# Patient Record
Sex: Male | Born: 1990 | Race: Black or African American | Hispanic: No | Marital: Single | Smoking: Current every day smoker
Health system: Southern US, Community
[De-identification: ages and names within clinical notes are randomized; demographics above are authoritative.]

## PROBLEM LIST (undated history)

## (undated) DIAGNOSIS — R011 Cardiac murmur, unspecified: Secondary | ICD-10-CM

---
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
## 2010-05-16 ENCOUNTER — Ambulatory Visit: Payer: Self-pay

## 2012-06-19 ENCOUNTER — Emergency Department (HOSPITAL_COMMUNITY)
Admission: EM | Admit: 2012-06-19 | Discharge: 2012-06-19 | Disposition: A | Discharge status: Home / Self Care | Payer: No Typology Code available for payment source | Attending: Emergency Medicine | Admitting: Emergency Medicine

## 2012-06-19 ENCOUNTER — Encounter (HOSPITAL_COMMUNITY): Payer: Self-pay | Admitting: Emergency Medicine

## 2012-06-19 DIAGNOSIS — Y9389 Activity, other specified: Secondary | ICD-10-CM | POA: Insufficient documentation

## 2012-06-19 DIAGNOSIS — Z79899 Other long term (current) drug therapy: Secondary | ICD-10-CM | POA: Insufficient documentation

## 2012-06-19 DIAGNOSIS — F17200 Nicotine dependence, unspecified, uncomplicated: Secondary | ICD-10-CM | POA: Insufficient documentation

## 2012-06-19 DIAGNOSIS — Y92410 Unspecified street and highway as the place of occurrence of the external cause: Secondary | ICD-10-CM | POA: Insufficient documentation

## 2012-06-19 DIAGNOSIS — S7010XA Contusion of unspecified thigh, initial encounter: Secondary | ICD-10-CM | POA: Insufficient documentation

## 2012-06-19 DIAGNOSIS — S7011XA Contusion of right thigh, initial encounter: Secondary | ICD-10-CM

## 2012-06-19 HISTORY — DX: Cardiac murmur, unspecified: R01.1

## 2012-06-19 MED ORDER — CYCLOBENZAPRINE HCL 10 MG PO TABS: 10.0000 mg | ORAL_TABLET | Freq: Two times a day (BID) | ORAL | Status: DC | PRN

## 2012-06-19 MED ORDER — IBUPROFEN 800 MG PO TABS: 800.0000 mg | ORAL_TABLET | Freq: Three times a day (TID) | ORAL | Status: DC

## 2012-06-19 MED ORDER — IBUPROFEN 800 MG PO TABS
800.0000 mg | ORAL_TABLET | Freq: Once | ORAL | Status: AC
  Administered 2012-06-19: 800 mg via ORAL
  Filled 2012-06-19: qty 1
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
## 2012-06-21 NOTE — ED Provider Notes (Signed)
Medical screening examination/treatment/procedure(s) were performed by non-physician practitioner and as supervising physician I was immediately available for consultation/collaboration.   Hurman Horn, MD 06/21/12 [phone]

## 2013-02-24 ENCOUNTER — Emergency Department (HOSPITAL_COMMUNITY)
Admission: EM | Admit: 2013-02-24 | Discharge: 2013-02-25 | Disposition: A | Discharge status: Home / Self Care | Payer: PRIVATE HEALTH INSURANCE | Attending: Emergency Medicine | Admitting: Emergency Medicine

## 2013-02-24 ENCOUNTER — Encounter (HOSPITAL_COMMUNITY): Payer: Self-pay | Admitting: Emergency Medicine

## 2013-02-24 DIAGNOSIS — Y9239 Other specified sports and athletic area as the place of occurrence of the external cause: Secondary | ICD-10-CM | POA: Insufficient documentation

## 2013-02-24 DIAGNOSIS — R609 Edema, unspecified: Secondary | ICD-10-CM | POA: Insufficient documentation

## 2013-02-24 DIAGNOSIS — Y9367 Activity, basketball: Secondary | ICD-10-CM | POA: Insufficient documentation

## 2013-02-24 DIAGNOSIS — M84375A Stress fracture, left foot, initial encounter for fracture: Secondary | ICD-10-CM

## 2013-02-24 DIAGNOSIS — X500XXA Overexertion from strenuous movement or load, initial encounter: Secondary | ICD-10-CM | POA: Insufficient documentation

## 2013-02-24 DIAGNOSIS — S92309A Fracture of unspecified metatarsal bone(s), unspecified foot, initial encounter for closed fracture: Secondary | ICD-10-CM | POA: Insufficient documentation

## 2013-02-24 DIAGNOSIS — F17200 Nicotine dependence, unspecified, uncomplicated: Secondary | ICD-10-CM | POA: Insufficient documentation

## 2013-02-24 NOTE — ED Notes (Signed)
Pt reports playing bball about 1 hour ago and twisted ankle. Mild swelling to area, no deformities. Distal pulses present. Pain 9/10.   p-72, bp-128p

## 2013-02-25 ENCOUNTER — Emergency Department (HOSPITAL_COMMUNITY): Payer: PRIVATE HEALTH INSURANCE

## 2013-02-25 MED ORDER — IBUPROFEN 400 MG PO TABS
800.0000 mg | ORAL_TABLET | Freq: Once | ORAL | Status: AC
  Administered 2013-02-25: 800 mg via ORAL
  Filled 2013-02-25: qty 2

## 2013-02-25 MED ORDER — OXYCODONE-ACETAMINOPHEN 5-325 MG PO TABS: 2.0000 | ORAL_TABLET | ORAL | Status: DC | PRN

## 2013-02-25 NOTE — ED Provider Notes (Signed)
Medical screening examination/treatment/procedure(s) were performed by non-physician practitioner and as supervising physician I was immediately available for consultation/collaboration.  EKG Interpretation   None         Enid Skeens, MD 02/25/13 [phone]

## 2013-02-25 NOTE — ED Provider Notes (Signed)
CSN: [id number]     Arrival date & time 02/24/13  2348 History   First MD Initiated Contact with Patient 02/25/13 0014     Chief Complaint  Patient presents with  . Foot Injury   (Consider location/radiation/quality/duration/timing/severity/associated sxs/prior Treatment) HPI  22 year old male presents for evaluation of left foot injury. Patient states he was playing basketball about 2 hours ago, and as he jumped and landed on his left foot he experienced an acute onset of sharp throbbing pain to the lateral aspects of his feet and also felt a pop. He is unable to bear weight afterwards. Pain is persistent, worsening with movement and improves with rest. Denies any significant ankle or knee pain. Denies any other injury. No specific treatment tried. Denies any numbness or weakness. Denies fever, or rash.    Past Medical History  Diagnosis Date  . Heart murmur    History reviewed. No pertinent past surgical history. No family history on file. History  Substance Use Topics  . Smoking status: Current Every Day Smoker    Types: Cigarettes  . Smokeless tobacco: Not on file  . Alcohol Use: No    Review of Systems  Constitutional: Negative for fever.  Musculoskeletal: Positive for arthralgias.  Skin: Negative for rash and wound.  Neurological: Negative for numbness.    Allergies  Penicillins  Home Medications   Current Outpatient Rx  Name  Route  Sig  Dispense  Refill  . ibuprofen (ADVIL,MOTRIN) 200 MG tablet   Oral   Take 200 mg by mouth every 6 (six) hours as needed.          BP 123/66  Pulse 76  Temp(Src) 98.5 F (36.9 C) (Oral)  Resp 16  Ht 6\' 2"  (1.88 m)  Wt 225 lb (102.059 kg)  BMI 28.88 kg/m2  SpO2 100% Physical Exam  Constitutional: He appears well-developed and well-nourished. No distress.  HENT:  Head: Atraumatic.  Eyes: Conjunctivae are normal.  Neck: Normal range of motion. Neck supple.  Cardiovascular: Intact distal pulses.   Musculoskeletal:     Left knee: Normal.       Left ankle: Normal.       Left foot: He exhibits decreased range of motion, tenderness, bony tenderness, swelling and crepitus. He exhibits normal capillary refill, no deformity and no laceration.       Feet:  Neurological: He is alert.  Skin: No rash noted.  Psychiatric: He has a normal mood and affect.    ED Course  Procedures (including critical care time)  12:53 AM L foot injury, no ankle involvement.  Xray of L foot demonstrate an acute fx at the proximal aspect of the fifth metatarsal.  It is a closed nondisplaced fx. Is NVI.    Postop shoe, and crutches given.  Pain medication prescribed.  Ortho referral given as needed.    Labs Review Labs Reviewed - No data to display Imaging Review Dg Foot Complete Left  02/25/2013   CLINICAL DATA:  Landed on left foot while playing basketball, with sudden onset of left lateral foot pain.  EXAM: LEFT FOOT - COMPLETE 3+ VIEW  COMPARISON:  None.  FINDINGS: There appears to be an acute fracture through an area of chronic stress injury along the proximal aspect of the fifth metatarsal, with associated cortical thickening. No additional fractures are seen. The fracture is essentially nondisplaced. There is no evidence of intra-articular extension.  Visualized joint spaces are preserved. An os peroneum is noted the subtalar joint is unremarkable in  appearance. Mild soft tissue swelling is noted about the fracture site.  IMPRESSION: Apparent acute fracture through an area of underlying chronic stress injury along the proximal aspect of the fifth metatarsal, with associated cortical thickening. The fracture is essentially nondisplaced; no evidence of intra-articular extension.   Electronically Signed   By: Roanna Raider M.D.   On: 02/25/2013 00:23    EKG Interpretation   None       MDM   1. Metatarsal stress fracture, left, initial encounter    BP 123/66  Pulse 76  Temp(Src) 98.5 F (36.9 C) (Oral)  Resp 16  Ht 6'  2" (1.88 m)  Wt 225 lb (102.059 kg)  BMI 28.88 kg/m2  SpO2 100%  I have reviewed nursing notes and vital signs. I personally reviewed the imaging tests through PACS system  I reviewed available ER/hospitalization records thought the EMR     Fayrene Helper, [place] 02/25/13 0059

## 2013-05-03 ENCOUNTER — Emergency Department (HOSPITAL_COMMUNITY)
Admission: EM | Admit: 2013-05-03 | Discharge: 2013-05-03 | Disposition: A | Discharge status: Home / Self Care | Payer: PRIVATE HEALTH INSURANCE | Attending: Emergency Medicine | Admitting: Emergency Medicine

## 2013-05-03 ENCOUNTER — Encounter (HOSPITAL_COMMUNITY): Payer: Self-pay | Admitting: Emergency Medicine

## 2013-05-03 DIAGNOSIS — F17200 Nicotine dependence, unspecified, uncomplicated: Secondary | ICD-10-CM | POA: Insufficient documentation

## 2013-05-03 DIAGNOSIS — T148XXA Other injury of unspecified body region, initial encounter: Secondary | ICD-10-CM

## 2013-05-03 DIAGNOSIS — M79606 Pain in leg, unspecified: Secondary | ICD-10-CM

## 2013-05-03 DIAGNOSIS — IMO0002 Reserved for concepts with insufficient information to code with codable children: Secondary | ICD-10-CM | POA: Insufficient documentation

## 2013-05-03 DIAGNOSIS — Z8781 Personal history of (healed) traumatic fracture: Secondary | ICD-10-CM | POA: Insufficient documentation

## 2013-05-03 DIAGNOSIS — X503XXA Overexertion from repetitive movements, initial encounter: Secondary | ICD-10-CM | POA: Insufficient documentation

## 2013-05-03 DIAGNOSIS — M79609 Pain in unspecified limb: Secondary | ICD-10-CM | POA: Insufficient documentation

## 2013-05-03 DIAGNOSIS — Y929 Unspecified place or not applicable: Secondary | ICD-10-CM | POA: Insufficient documentation

## 2013-05-03 DIAGNOSIS — R011 Cardiac murmur, unspecified: Secondary | ICD-10-CM | POA: Insufficient documentation

## 2013-05-03 DIAGNOSIS — Y939 Activity, unspecified: Secondary | ICD-10-CM | POA: Insufficient documentation

## 2013-05-03 DIAGNOSIS — Z880 Allergy status to penicillin: Secondary | ICD-10-CM | POA: Insufficient documentation

## 2013-05-03 MED ORDER — IBUPROFEN 800 MG PO TABS
800.0000 mg | ORAL_TABLET | Freq: Once | ORAL | Status: AC
  Administered 2013-05-03: 800 mg via ORAL
  Filled 2013-05-03: qty 1
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
## 2013-05-03 NOTE — ED Notes (Signed)
Dr.Knapp at bedside  

## 2013-05-03 NOTE — ED Notes (Signed)
Pt dc to home. Pt sts understanding to dc instructions. Pt ambulatory to exit without difficulty. 
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
## 2013-05-05 NOTE — ED Provider Notes (Signed)
I saw and evaluated the patient, reviewed the resident's note and I agree with the findings and plan.  On exam, no edema or erythema.  Overall low suspicion for DVT although with his recent orthopedic injury will have pt follow up in the am to have a doppler ultrasound.  Celene KrasJon R Patina Spanier, MD 05/05/13 [phone]

## 2013-10-23 ENCOUNTER — Emergency Department (HOSPITAL_COMMUNITY)
Admission: EM | Admit: 2013-10-23 | Discharge: 2013-10-23 | Disposition: A | Discharge status: Home / Self Care | Payer: PRIVATE HEALTH INSURANCE | Attending: Emergency Medicine | Admitting: Emergency Medicine

## 2013-10-23 ENCOUNTER — Emergency Department (HOSPITAL_COMMUNITY): Payer: PRIVATE HEALTH INSURANCE

## 2013-10-23 ENCOUNTER — Encounter (HOSPITAL_COMMUNITY): Payer: Self-pay | Admitting: Emergency Medicine

## 2013-10-23 DIAGNOSIS — F17200 Nicotine dependence, unspecified, uncomplicated: Secondary | ICD-10-CM | POA: Insufficient documentation

## 2013-10-23 DIAGNOSIS — Y9389 Activity, other specified: Secondary | ICD-10-CM | POA: Insufficient documentation

## 2013-10-23 DIAGNOSIS — IMO0002 Reserved for concepts with insufficient information to code with codable children: Secondary | ICD-10-CM | POA: Insufficient documentation

## 2013-10-23 DIAGNOSIS — Z791 Long term (current) use of non-steroidal anti-inflammatories (NSAID): Secondary | ICD-10-CM | POA: Insufficient documentation

## 2013-10-23 DIAGNOSIS — Y929 Unspecified place or not applicable: Secondary | ICD-10-CM | POA: Insufficient documentation

## 2013-10-23 DIAGNOSIS — T148XXA Other injury of unspecified body region, initial encounter: Secondary | ICD-10-CM

## 2013-10-23 DIAGNOSIS — M549 Dorsalgia, unspecified: Secondary | ICD-10-CM | POA: Insufficient documentation

## 2013-10-23 DIAGNOSIS — Y990 Civilian activity done for income or pay: Secondary | ICD-10-CM | POA: Insufficient documentation

## 2013-10-23 DIAGNOSIS — Z880 Allergy status to penicillin: Secondary | ICD-10-CM | POA: Insufficient documentation

## 2013-10-23 DIAGNOSIS — M25569 Pain in unspecified knee: Secondary | ICD-10-CM | POA: Insufficient documentation

## 2013-10-23 DIAGNOSIS — M25579 Pain in unspecified ankle and joints of unspecified foot: Secondary | ICD-10-CM | POA: Insufficient documentation

## 2013-10-23 DIAGNOSIS — X503XXA Overexertion from repetitive movements, initial encounter: Secondary | ICD-10-CM | POA: Insufficient documentation

## 2013-10-23 DIAGNOSIS — Z79899 Other long term (current) drug therapy: Secondary | ICD-10-CM | POA: Insufficient documentation

## 2013-10-23 DIAGNOSIS — R011 Cardiac murmur, unspecified: Secondary | ICD-10-CM | POA: Insufficient documentation

## 2013-10-23 LAB — URINALYSIS, ROUTINE W REFLEX MICROSCOPIC
Bilirubin Urine: NEGATIVE
Glucose, UA: NEGATIVE mg/dL
Hgb urine dipstick: NEGATIVE
LEUKOCYTES UA: NEGATIVE
NITRITE: NEGATIVE
PH: 5.5 (ref 5.0–8.0)
PROTEIN: NEGATIVE mg/dL
Specific Gravity, Urine: 1.03 (ref 1.005–1.030)
Urobilinogen, UA: 0.2 mg/dL (ref 0.0–1.0)

## 2013-10-23 MED ORDER — CYCLOBENZAPRINE HCL 10 MG PO TABS: 10.0000 mg | ORAL_TABLET | Freq: Two times a day (BID) | ORAL | Status: DC | PRN

## 2013-10-23 MED ORDER — NAPROXEN 375 MG PO TABS: 375.0000 mg | ORAL_TABLET | Freq: Two times a day (BID) | ORAL | Status: DC

## 2013-10-23 NOTE — Discharge Instructions (Signed)
Back Pain, Adult Low back pain is very common. About 1 in 5 people have back pain.The cause of low back pain is rarely dangerous. The pain often gets better over time.About half of people with a sudden onset of back pain feel better in just 2 weeks. About 8 in 10 people feel better by 6 weeks.  CAUSES Some common causes of back pain include:  Strain of the muscles or ligaments supporting the spine.  Wear and tear (degeneration) of the spinal discs.  Arthritis.  Direct injury to the back. DIAGNOSIS Most of the time, the direct cause of low back pain is not known.However, back pain can be treated effectively even when the exact cause of the pain is unknown.Answering your caregiver's questions about your overall health and symptoms is one of the most accurate ways to make sure the cause of your pain is not dangerous. If your caregiver needs more information, he or she may order lab work or imaging tests (X-rays or MRIs).However, even if imaging tests show changes in your back, this usually does not require surgery. HOME CARE INSTRUCTIONS For many people, back pain returns.Since low back pain is rarely dangerous, it is often a condition that people can learn to manageon their own.   Remain active. It is stressful on the back to sit or stand in one place. Do not sit, drive, or stand in one place for more than 30 minutes at a time. Take short walks on level surfaces as soon as pain allows.Try to increase the length of time you walk each day.  Do not stay in bed.Resting more than 1 or 2 days can delay your recovery.  Do not avoid exercise or work.Your body is made to move.It is not dangerous to be active, even though your back may hurt.Your back will likely heal faster if you return to being active before your pain is gone.  Pay attention to your body when you bend and lift. Many people have less discomfortwhen lifting if they bend their knees, keep the load close to their bodies,and  avoid twisting. Often, the most comfortable positions are those that put less stress on your recovering back.  Find a comfortable position to sleep. Use a firm mattress and lie on your side with your knees slightly bent. If you lie on your back, put a pillow under your knees.  Only take over-the-counter or prescription medicines as directed by your caregiver. Over-the-counter medicines to reduce pain and inflammation are often the most helpful.Your caregiver may prescribe muscle relaxant drugs.These medicines help dull your pain so you can more quickly return to your normal activities and healthy exercise.  Put ice on the injured area.  Put ice in a plastic bag.  Place a towel between your skin and the bag.  Leave the ice on for 15-20 minutes, 03-04 times a day for the first 2 to 3 days. After that, ice and heat may be alternated to reduce pain and spasms.  Ask your caregiver about trying back exercises and gentle massage. This may be of some benefit.  Avoid feeling anxious or stressed.Stress increases muscle tension and can worsen back pain.It is important to recognize when you are anxious or stressed and learn ways to manage it.Exercise is a great option. SEEK MEDICAL CARE IF:  You have pain that is not relieved with rest or medicine.  You have pain that does not improve in 1 week.  You have new symptoms.  You are generally not feeling well. SEEK   IMMEDIATE MEDICAL CARE IF:   You have pain that radiates from your back into your legs.  You develop new bowel or bladder control problems.  You have unusual weakness or numbness in your arms or legs.  You develop nausea or vomiting.  You develop abdominal pain.  You feel faint. Document Released: 02/20/2005 Document Revised: 08/22/2011 Document Reviewed: 06/24/2013 [employer] Patient Information 2015 [employer]. This information is not intended to replace advice given to you by your health care provider. Make sure you  discuss any questions you have with your health care provider.  

## 2013-10-23 NOTE — ED Notes (Signed)
[place], PA at bedside  

## 2013-10-23 NOTE — ED Notes (Signed)
Pt ambulatory to room.

## 2013-10-23 NOTE — ED Provider Notes (Signed)
CSN: [id number]     Arrival date & time 10/23/13  1550 History   First MD Initiated Contact with Patient 10/23/13 1823     Chief Complaint  Patient presents with  . Back Pain     (Consider location/radiation/quality/duration/timing/severity/associated sxs/prior Treatment) HPI  Patient presents to the emergency department for evaluation of his low back back and feels as though his stomach is bubbling (for a week), He recently started a new job which requires him to lift heavy objects. He also plays basketball and reports having knee pains and ankle pains sometimes. He denies having constipation, diarrhea, fevers, weakness. His pain is worsened by movement and palpation. He does not have pain at rest but says he has "bubbling" in his stomach when he rests.   Past Medical History  Diagnosis Date  . Heart murmur    History reviewed. No pertinent past surgical history. No family history on file. History  Substance Use Topics  . Smoking status: Current Every Day Smoker -- 1.00 packs/day    Types: Cigarettes  . Smokeless tobacco: Never Used  . Alcohol Use: No    Review of Systems   Review of Systems  Gen: no weight loss, fevers, chills, night sweats  Eyes: no occular draining, occular pain,  No visual changes  Nose: no epistaxis or rhinorrhea  Mouth: no dental pain, no sore throat  Neck: no neck pain  Lungs: No hemoptysis. No wheezing or coughing CV:  No palpitations, dependent edema or orthopnea. No chest pain Abd: no diarrhea. No nausea or vomiting, No abdominal pain  "bubbly stomach" GU: no dysuria or gross hematuria  MSK:  No muscle weakness, + low back muscular pain Neuro: no headache, no focal neurologic deficits  Skin: no rash , no wounds Psyche: no complaints of depression or anxiety    Allergies  Penicillins  Home Medications   Prior to Admission medications   Medication Sig Start Date End Date Taking? Authorizing Provider  ibuprofen (ADVIL,MOTRIN) 200 MG  tablet Take 200 mg by mouth every 6 (six) hours as needed for moderate pain.    Yes Historical Provider, MD  senna (SENOKOT) 8.6 MG TABS tablet Take 1 tablet by mouth daily as needed for mild constipation.   Yes Historical Provider, MD  cyclobenzaprine (FLEXERIL) 10 MG tablet Take 1 tablet (10 mg total) by mouth 2 (two) times daily as needed for muscle spasms. 10/23/13   Clarice Zulauf Irine Seal, PA-C  naproxen (NAPROSYN) 375 MG tablet Take 1 tablet (375 mg total) by mouth 2 (two) times daily. 10/23/13   Shifa Brisbon Irine Seal, PA-C   BP 120/82  Pulse 72  Temp(Src) 97.6 F (36.4 C) (Oral)  Resp 18  Ht 6\' 3"  (1.905 m)  Wt 198 lb (89.812 kg)  BMI 24.75 kg/m2  SpO2 100% Physical Exam  Nursing note and vitals reviewed. Constitutional: He appears well-developed and well-nourished. No distress.  HENT:  Head: Normocephalic and atraumatic.  Eyes: Pupils are equal, round, and reactive to light.  Neck: Normal range of motion. Neck supple.  Cardiovascular: Normal rate and regular rhythm.   Pulmonary/Chest: Effort normal.  Abdominal: Soft. Bowel sounds are normal. He exhibits no distension, no fluid wave and no ascites. There is no tenderness. There is no rigidity, no rebound, no guarding and no CVA tenderness.  Musculoskeletal:  Pt has equal strength to bilateral lower extremities.  Neurosensory function adequate to both legs No clonus on dorsiflextion Skin color is normal. Skin is warm and moist.  I see no  step off deformity, no midline bony tenderness.  Pt is able to ambulate.  No crepitus, laceration, effusion, induration, lesions, swelling.   Pedal pulses are symmetrical and palpable bilaterally  Mild/mod tenderness to palpation of bilateral lumbar paraspinel muscles   Neurological: He is alert.  Skin: Skin is warm and dry.    ED Course  Procedures (including critical care time) Labs Review Labs Reviewed  URINALYSIS, ROUTINE W REFLEX MICROSCOPIC - Abnormal; Notable for the following:    Ketones,  ur >80 (*)    All other components within normal limits    Imaging Review Dg Abd 2 Views  10/23/2013   CLINICAL DATA:  Back pain.  EXAM: ABDOMEN - 2 VIEW  COMPARISON:  No prior.  FINDINGS: Soft tissue structures unremarkable. Gas pattern is nonspecific view stone noted throughout the colon. No acute bony abnormality. Lung bases clear.  IMPRESSION: No acute abnormality.   Electronically Signed   By: Maisie Fushomas  Register   On: 10/23/2013 20:42     EKG Interpretation None      MDM   Final diagnoses:  Muscle strain    23 y.o.Correy D Fordham's  with back pain. No neurological deficits and normal neuro exam. Patient can walk. No loss of bowel or bladder control. No concern for cauda equina at this time base on HPI and physical exam findings. No fever, night sweats, weight loss, h/o cancer, IVDU.   RICE protocol and pain medicine indicated and discussed with patient.    Referral to Ortho and Gastroenterology.  Patient Plan 1. Medications: NSAIDs and muscle relaxer. Cont usual home medications unless otherwise directed. 2. Treatment: rest, drink plenty of fluids, gentle stretching as discussed, alternate ice and heat  3. Follow Up: Please followup with your primary doctor for discussion of your diagnoses and further evaluation after today's visit; if you do not have a primary care doctor use the resource guide provided to find one   Vital signs are stable at discharge. Filed Vitals:   10/23/13 2000  BP: 120/82  Pulse: 72  Temp:   Resp:     Patient/guardian has voiced understanding and agreed to follow-up with the PCP or specialist.         Dorthula Matasiffany G Carli Lefevers, PA-C 10/23/13 2057

## 2013-10-23 NOTE — ED Notes (Addendum)
Pt reports 8/10 lower back pain. Reports chronic back pain but states that this feels different. Pt denies dysuria, frequency or hematuria. States "I feel like I can feel bubbles in my back." States pain radiates to hips. Denies abdominal pain, V/D, but states he has been taking laxatives x 2 days because "I thought this was gas." NAD. Ambulatory to triage.

## 2013-10-24 NOTE — ED Provider Notes (Signed)
Medical screening examination/treatment/procedure(s) were performed by non-physician practitioner and as supervising physician I was immediately available for consultation/collaboration.   EKG Interpretation None       Doug SouSam Mazella Deen, MD 10/24/13 [phone]

## 2013-12-12 ENCOUNTER — Encounter (HOSPITAL_COMMUNITY): Payer: Self-pay | Admitting: Emergency Medicine

## 2013-12-12 ENCOUNTER — Emergency Department (HOSPITAL_COMMUNITY)
Admission: EM | Admit: 2013-12-12 | Discharge: 2013-12-12 | Disposition: A | Discharge status: Home / Self Care | Payer: PRIVATE HEALTH INSURANCE | Source: Home / Self Care | Attending: Emergency Medicine | Admitting: Emergency Medicine

## 2013-12-12 DIAGNOSIS — M545 Low back pain, unspecified: Secondary | ICD-10-CM

## 2013-12-12 DIAGNOSIS — K2970 Gastritis, unspecified, without bleeding: Secondary | ICD-10-CM

## 2013-12-12 MED ORDER — MELOXICAM 15 MG PO TABS: 15.0000 mg | ORAL_TABLET | Freq: Every day | ORAL | Status: AC

## 2013-12-12 MED ORDER — OMEPRAZOLE 20 MG PO CPDR: 20.0000 mg | DELAYED_RELEASE_CAPSULE | Freq: Every day | ORAL | Status: AC

## 2013-12-12 MED ORDER — CYCLOBENZAPRINE HCL 10 MG PO TABS: 10.0000 mg | ORAL_TABLET | Freq: Three times a day (TID) | ORAL | Status: AC | PRN

## 2013-12-12 NOTE — ED Provider Notes (Signed)
CSN: [id number]     Arrival date & time 12/12/13  1618 History   First MD Initiated Contact with Patient 12/12/13 1656     Chief Complaint  Patient presents with  . Abdominal Pain  . Back Pain   (Consider location/radiation/quality/duration/timing/severity/associated sxs/prior Treatment) HPI He is a 23 year old man here today for evaluation of epigastric pain and low back pain.  He states the epigastric pain started one to 2 weeks ago. It is located centrally and radiates a little to the right. It is worse with lying flat. It is associated with some nausea. He denies any vomiting, diarrhea, blood in stool. He has been taking some ibuprofen the last several months for his back pain, he states he takes maybe 400 mg a day.  He reports left-sided low back pain for the last 3 months. 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 The pain does not radiate. He has no bowel or bladder incontinence. No numbness, tingling, weakness. The pain is worse when he slouches. It is better when he is active. He states he can feel a lump in the left lower back.  Past Medical History  Diagnosis Date  . Heart murmur    History reviewed. No pertinent past surgical history. History reviewed. No pertinent family history. History  Substance Use Topics  . Smoking status: Current Every Day Smoker -- 1.00 packs/day    Types: Cigarettes  . Smokeless tobacco: Never Used  . Alcohol Use: No    Review of Systems  Constitutional: Negative.   Respiratory: Negative.   Cardiovascular: Negative.   Gastrointestinal: Positive for nausea and abdominal pain. Negative for vomiting, diarrhea and blood in stool.  Musculoskeletal: Positive for back pain.    Allergies  Penicillins  Home Medications   Prior to Admission medications   Medication Sig Start Date End Date Taking? Authorizing Provider  cyclobenzaprine (FLEXERIL) 10 MG tablet Take 1 tablet (10  mg total) by mouth 3 (three) times daily as needed for muscle spasms. 12/12/13   Charm RingsErin J Zeric Baranowski, MD  meloxicam (MOBIC) 15 MG tablet Take 1 tablet (15 mg total) by mouth daily. 12/12/13   Charm RingsErin J Furious Chiarelli, MD  omeprazole (PRILOSEC) 20 MG capsule Take 1 capsule (20 mg total) by mouth daily. 12/12/13   Charm RingsErin J Tonda Wiederhold, MD   BP 106/69  Pulse 78  Temp(Src) 98.8 F (37.1 C) (Oral)  Resp 16  SpO2 97% Physical Exam  Constitutional: He is oriented to person, place, and time. He appears well-developed and well-nourished. No distress.  HENT:  Head: Normocephalic and atraumatic.  Neck: Neck supple.  Cardiovascular: Normal rate, regular rhythm and normal heart sounds.   No murmur heard. Pulmonary/Chest: Effort normal and breath sounds normal. No respiratory distress. He has no wheezes. He has no rales.  Abdominal: Soft. Bowel sounds are normal. He exhibits no distension and no mass. There is tenderness (epigastric and RUQ). There is no rebound and no guarding.  Musculoskeletal:  Back: no erythema or edema.  No vertebral step-offs or tenderness.  Nodule palpated in left lumbar paraspinous muscle that is tender.  No SI joint tenderness or piriformis tenderness.    Neurological: He is alert and oriented to person, place, and time. He exhibits normal muscle tone.    ED Course  Procedures (including critical care time) Labs Review Labs Reviewed - No data to display  Imaging Review No results found.   MDM  1. Gastritis   2. Left-sided low back pain without sciatica    Low back pain is likely secondary to a muscular trigger point. Will treat with Flexeril and meloxicam.  Likely has some early gastritis. Discussed with him that smoking will contribute to this. Recommended smoking cessation. Start omeprazole 20 mg daily.  If no improvement in 2-3 weeks, he is to return for additional evaluation. Warning signs reviewed as in after visit summary.    Charm RingsErin J Yamilett Anastos, MD 12/12/13 [phone]

## 2013-12-12 NOTE — ED Notes (Signed)
C/o  Epigastric pain x 1 wk with nausea.  Denies fever, vomiting and diarrhea.  States feels like a tightness and feels like I have to burp a lot.     States pain is worse at night.    Also c/o lower back pain pain.  Denies any urinary symptoms or discharge.  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    Mild relief of pain with using advil.     Back pain x 3 months.

## 2013-12-12 NOTE — Discharge Instructions (Signed)
You have a muscle knot in the left lower back. Take Flexeril (a muscle relaxer) every 8 hours as needed. Take meloxicam 15mg  daily for 2 weeks, then as needed.  You can take tylenol with this if needed.  The stomach pain is some irritation of the stomach lining. Take omeprazole 20mg  daily for the next month. You should see improvement in the next 2 weeks.  If your symptoms change or worsen, you develop severe abdominal pain or start vomiting blood, please go to the emergency room.

## 2014-01-13 ENCOUNTER — Emergency Department (HOSPITAL_COMMUNITY)
Admission: EM | Admit: 2014-01-13 | Discharge: 2014-01-13 | Discharge status: Against Medical Advice | Payer: Managed Care, Other (non HMO) | Attending: Emergency Medicine | Admitting: Emergency Medicine

## 2014-01-13 ENCOUNTER — Encounter (HOSPITAL_COMMUNITY): Payer: Self-pay | Admitting: Emergency Medicine

## 2014-01-13 DIAGNOSIS — R000 Tachycardia, unspecified: Secondary | ICD-10-CM | POA: Diagnosis present

## 2014-01-13 DIAGNOSIS — R011 Cardiac murmur, unspecified: Secondary | ICD-10-CM | POA: Insufficient documentation

## 2014-01-13 DIAGNOSIS — Z720 Tobacco use: Secondary | ICD-10-CM | POA: Insufficient documentation

## 2014-01-13 DIAGNOSIS — F419 Anxiety disorder, unspecified: Secondary | ICD-10-CM | POA: Diagnosis not present

## 2014-01-13 LAB — BASIC METABOLIC PANEL
ANION GAP: 11 (ref 5–15)
BUN: 9 mg/dL (ref 6–23)
CHLORIDE: 98 meq/L (ref 96–112)
CO2: 27 mEq/L (ref 19–32)
Calcium: 9.6 mg/dL (ref 8.4–10.5)
Creatinine, Ser: 0.96 mg/dL (ref 0.50–1.35)
GFR calc Af Amer: >90 mL/min (ref >90)
Glucose, Bld: 118 mg/dL — ABNORMAL HIGH (ref 70–99)
Potassium: 4.4 mEq/L (ref 3.7–5.3)
SODIUM: 136 meq/L — AB (ref 137–147)

## 2014-01-13 LAB — CBC
HEMATOCRIT: 43 % (ref 39.0–52.0)
Hemoglobin: 15.1 g/dL (ref 13.0–17.0)
MCH: 31.8 pg (ref 26.0–34.0)
MCHC: 35.1 g/dL (ref 30.0–36.0)
MCV: 90.5 fL (ref 78.0–100.0)
PLATELETS: 276 10*3/uL (ref 150–400)
RBC: 4.75 MIL/uL (ref 4.22–5.81)
RDW: 11.7 % (ref 11.5–15.5)
WBC: 7.7 10*3/uL (ref 4.0–10.5)

## 2014-01-13 LAB — I-STAT TROPONIN, ED: TROPONIN I, POC: 0.02 ng/mL (ref 0.00–0.08)

## 2014-01-13 NOTE — ED Notes (Signed)
Pt called x3 to move to room in back, no answer.

## 2014-01-13 NOTE — ED Notes (Signed)
Per EMS-was smoking cigarette and felt heart beating fast and felt like he was having chest pain and then he thought he was having blood clots in back-life stressors, appears anxious

## 2014-03-16 ENCOUNTER — Emergency Department (HOSPITAL_COMMUNITY)
Admission: EM | Admit: 2014-03-16 | Discharge: 2014-03-16 | Disposition: A | Discharge status: Home / Self Care | Payer: PRIVATE HEALTH INSURANCE | Attending: Emergency Medicine | Admitting: Emergency Medicine

## 2014-03-16 ENCOUNTER — Encounter (HOSPITAL_COMMUNITY): Payer: Self-pay | Admitting: Emergency Medicine

## 2014-03-16 DIAGNOSIS — M5441 Lumbago with sciatica, right side: Secondary | ICD-10-CM | POA: Insufficient documentation

## 2014-03-16 DIAGNOSIS — Z880 Allergy status to penicillin: Secondary | ICD-10-CM | POA: Insufficient documentation

## 2014-03-16 DIAGNOSIS — Z791 Long term (current) use of non-steroidal anti-inflammatories (NSAID): Secondary | ICD-10-CM | POA: Insufficient documentation

## 2014-03-16 DIAGNOSIS — Z720 Tobacco use: Secondary | ICD-10-CM | POA: Insufficient documentation

## 2014-03-16 DIAGNOSIS — R1031 Right lower quadrant pain: Secondary | ICD-10-CM | POA: Insufficient documentation

## 2014-03-16 DIAGNOSIS — R011 Cardiac murmur, unspecified: Secondary | ICD-10-CM | POA: Insufficient documentation

## 2014-03-16 DIAGNOSIS — R630 Anorexia: Secondary | ICD-10-CM | POA: Insufficient documentation

## 2014-03-16 DIAGNOSIS — R1032 Left lower quadrant pain: Secondary | ICD-10-CM | POA: Insufficient documentation

## 2014-03-16 DIAGNOSIS — Z79899 Other long term (current) drug therapy: Secondary | ICD-10-CM | POA: Insufficient documentation

## 2014-03-16 LAB — BASIC METABOLIC PANEL
Anion gap: 9 (ref 5–15)
BUN: 8 mg/dL (ref 6–23)
CALCIUM: 9.3 mg/dL (ref 8.4–10.5)
CO2: 25 mmol/L (ref 19–32)
CREATININE: 0.98 mg/dL (ref 0.50–1.35)
Chloride: 102 mEq/L (ref 96–112)
GFR calc Af Amer: >90 mL/min (ref >90)
GFR calc non Af Amer: >90 mL/min (ref >90)
Glucose, Bld: 92 mg/dL (ref 70–99)
POTASSIUM: 4.1 mmol/L (ref 3.5–5.1)
SODIUM: 136 mmol/L (ref 135–145)

## 2014-03-16 LAB — CBC WITH DIFFERENTIAL/PLATELET
Basophils Absolute: 0 10*3/uL (ref 0.0–0.1)
Basophils Relative: 0 % (ref 0–1)
EOS ABS: 0.1 10*3/uL (ref 0.0–0.7)
EOS PCT: 1 % (ref 0–5)
HCT: 40.3 % (ref 39.0–52.0)
HEMOGLOBIN: 14.2 g/dL (ref 13.0–17.0)
LYMPHS PCT: 32 % (ref 12–46)
Lymphs Abs: 2.3 10*3/uL (ref 0.7–4.0)
MCH: 31.3 pg (ref 26.0–34.0)
MCHC: 35.2 g/dL (ref 30.0–36.0)
MCV: 88.8 fL (ref 78.0–100.0)
Monocytes Absolute: 0.4 10*3/uL (ref 0.1–1.0)
Monocytes Relative: 6 % (ref 3–12)
Neutro Abs: 4.6 10*3/uL (ref 1.7–7.7)
Neutrophils Relative %: 61 % (ref 43–77)
Platelets: 241 10*3/uL (ref 150–400)
RBC: 4.54 MIL/uL (ref 4.22–5.81)
RDW: 11.9 % (ref 11.5–15.5)
WBC: 7.4 10*3/uL (ref 4.0–10.5)

## 2014-03-16 LAB — URINALYSIS, ROUTINE W REFLEX MICROSCOPIC
Bilirubin Urine: NEGATIVE
Glucose, UA: NEGATIVE mg/dL
Hgb urine dipstick: NEGATIVE
KETONES UR: NEGATIVE mg/dL
LEUKOCYTES UA: NEGATIVE
NITRITE: NEGATIVE
PH: 7.5 (ref 5.0–8.0)
PROTEIN: NEGATIVE mg/dL
Specific Gravity, Urine: 1.025 (ref 1.005–1.030)
Urobilinogen, UA: 0.2 mg/dL (ref 0.0–1.0)

## 2014-03-16 MED ORDER — NAPROXEN 500 MG PO TABS: 500.0000 mg | ORAL_TABLET | Freq: Two times a day (BID) | ORAL | Status: AC

## 2014-03-16 MED ORDER — METHOCARBAMOL 500 MG PO TABS
500.0000 mg | ORAL_TABLET | Freq: Two times a day (BID) | ORAL | Status: AC | PRN
Start: 2014-03-16 — End: ?

## 2014-03-16 MED ORDER — DIAZEPAM 5 MG PO TABS
5.0000 mg | ORAL_TABLET | Freq: Once | ORAL | Status: AC
  Administered 2014-03-16: 5 mg via ORAL
  Filled 2014-03-16: qty 1

## 2014-03-16 MED ORDER — KETOROLAC TROMETHAMINE 60 MG/2ML IM SOLN
60.0000 mg | Freq: Once | INTRAMUSCULAR | Status: AC
  Administered 2014-03-16: 60 mg via INTRAMUSCULAR
  Filled 2014-03-16: qty 2

## 2014-03-16 MED ORDER — HYDROCODONE-ACETAMINOPHEN 5-325 MG PO TABS: 2.0000 | ORAL_TABLET | ORAL | Status: AC | PRN

## 2014-03-16 NOTE — ED Notes (Signed)
Pt calling for ride home 

## 2014-03-16 NOTE — Discharge Instructions (Signed)
You've her evaluated in the ED today for your back pain. There is appeared to be any emergent causes for your symptoms at this time. Please take your pain medicines as prescribed. Her abdominal pain also did not show any acute sources for your symptoms. There is still a possibility that you may have an early appendicitis. You will need to follow-up with primary care within 6-12 hours for reevaluation.

## 2014-03-16 NOTE — ED Notes (Signed)
Pt c/o left lower back pain into left groin area x 3 days

## 2014-03-16 NOTE — ED Provider Notes (Signed)
CSN: [id number]     Arrival date & time 03/16/14  1559 History   First MD Initiated Contact with Patient 03/16/14 1905     Chief Complaint  Patient presents with  . Back Pain  . Groin Pain     (Consider location/radiation/quality/duration/timing/severity/associated sxs/prior Treatment) HPI Jordan Glenn is a 24 y.o. male who comes in for evaluation of back pain and abdominal pain. Patient states he has had back pain for the past 5 years but it was exacerbated over the past 2 weeks. Patient states he can feel a lump with associated tenderness over his lower left back. He characterizes this sensation as a sharp stabbing pain that is exacerbated with movement, is relieved with hot showers. He is taken ibuprofen once with minimal relief. He reports intermittent numbness and weakness on both sides of his lower extremities. Denies fevers, loss of bowel or bladder, chronic steroid use, personal history of cancer, IV drug use. Denies any other injury or trauma. 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 Attributes back discomfort to his job. Patient also complains of abdominal pain that has been apparent for the past 2 days. He reports he has not been eating his typical diet and has some discomfort in his right lower abdomen as well as his left lower abdomen. Reports his last bowel movement was this morning as was normal for him. He reports this pain sometimes will radiate into his left groin. Denies dysuria, hematuria, penile discharge, scrotal swelling, nausea or vomiting, diarrhea or constipation..  Past Medical History  Diagnosis Date  . Heart murmur    History reviewed. No pertinent past surgical history. History reviewed. No pertinent family history. History  Substance Use Topics  . Smoking status: Current Every Day Smoker -- 1.00 packs/day    Types: Cigarettes  . Smokeless tobacco: Never Used  . Alcohol Use: No    Review of Systems  Gastrointestinal: Positive for  abdominal pain.  Musculoskeletal: Positive for back pain.  All other systems reviewed and are negative.  A 10 point review of systems was completed and was negative except for pertinent positives and negatives as mentioned in the history of present illness     Allergies  Penicillins  Home Medications   Prior to Admission medications   Medication Sig Start Date End Date Taking? Authorizing Provider  cyclobenzaprine (FLEXERIL) 10 MG tablet Take 1 tablet (10 mg total) by mouth 3 (three) times daily as needed for muscle spasms. 12/12/13   Charm Rings, MD  HYDROcodone-acetaminophen (NORCO/VICODIN) 5-325 MG per tablet Take 2 tablets by mouth every 4 (four) hours as needed for moderate pain or severe pain. 03/16/14   Earle Gell Carel Carrier, PA-C  meloxicam (MOBIC) 15 MG tablet Take 1 tablet (15 mg total) by mouth daily. 12/12/13   Charm Rings, MD  methocarbamol (ROBAXIN) 500 MG tablet Take 1 tablet (500 mg total) by mouth 2 (two) times daily as needed for muscle spasms. 03/16/14   Earle Gell Galadriel Shroff, PA-C  naproxen (NAPROSYN) 500 MG tablet Take 1 tablet (500 mg total) by mouth 2 (two) times daily. 03/16/14   Earle Gell Helyn Schwan, PA-C  omeprazole (PRILOSEC) 20 MG capsule Take 1 capsule (20 mg total) by mouth daily. 12/12/13   Charm Rings, MD   BP 134/93 mmHg  Pulse 60  Temp(Src) 97.9 F (36.6 C) (Oral)  SpO2 100% Physical Exam  Constitutional:  Awake, alert, nontoxic appearance with baseline speech.  HENT:  Head: Atraumatic.  Eyes:  Pupils are equal, round, and reactive to light. Right eye exhibits no discharge. Left eye exhibits no discharge.  Neck: Neck supple.  Cardiovascular: Normal rate and regular rhythm.   No murmur heard. Pulmonary/Chest: Effort normal and breath sounds normal. No respiratory distress. He has no wheezes. He has no rales. He exhibits no tenderness.  Abdominal: Soft. Bowel sounds are normal. He exhibits no mass. There is no tenderness. There is no rebound.  Genitourinary:  Penis normal.  Testicles at equal and appropriate lie. No overt erythema, edema. No lesions or deformities appreciated. No epididymal tenderness. No penile discharge. No hernias appreciated. Normal exam  Musculoskeletal:  Left lumbar muscle spasm on exam. No erythema or warmth or overt midline bony tenderness. Positive R straight leg raise.  Bilateral lower extremities non tender without new rashes or color change, baseline ROM with intact DP / PT pulses, sensation baseline light touch bilaterally for pt, motor symmetric bilateral 5 / 5 hip flexion, quadriceps, hamstrings, EHL, foot dorsiflexion, foot plantarflexion, gait baseline without apparent new ataxia.  Neurological:  Mental status baseline for patient.  Upper extremity motor strength and sensation intact and symmetric bilaterally.  Skin: No rash noted.  Psychiatric: He has a normal mood and affect.  Nursing note and vitals reviewed.   ED Course  Procedures (including critical care time) Labs Review Labs Reviewed  URINALYSIS, ROUTINE W REFLEX MICROSCOPIC  CBC WITH DIFFERENTIAL  BASIC METABOLIC PANEL    Imaging Review No results found.   EKG Interpretation None     Meds given in ED:  Medications  ketorolac (TORADOL) injection 60 mg (60 mg Intramuscular Given 03/16/14 2007)  diazepam (VALIUM) tablet 5 mg (5 mg Oral Given 03/16/14 2006)    Discharge Medication List as of 03/16/2014  9:03 PM    START taking these medications   Details  HYDROcodone-acetaminophen (NORCO/VICODIN) 5-325 MG per tablet Take 2 tablets by mouth every 4 (four) hours as needed for moderate pain or severe pain., Starting 03/16/2014, Until Discontinued, Print    methocarbamol (ROBAXIN) 500 MG tablet Take 1 tablet (500 mg total) by mouth 2 (two) times daily as needed for muscle spasms., Starting 03/16/2014, Until Discontinued, Print    naproxen (NAPROSYN) 500 MG tablet Take 1 tablet (500 mg total) by mouth 2 (two) times daily., Starting 03/16/2014, Until  Discontinued, Print       Filed Vitals:   03/16/14 1608 03/16/14 2000 03/16/14 2108 03/16/14 2110  BP: 135/66 123/64 134/93   Pulse: 88 60  60  Temp: 97.9 F (36.6 C)     TempSrc: Oral     SpO2: 98% 100%  100%    MDM  Vitals stable - WNL -afebrile Pt resting comfortably in ED. Patient reports pain has resolved with administration of medications.  PE--muscle spasm appreciated on initial physical exam has resolved with medications given in ED.Mild tenderness diffusely in abdomen with no specific right lower quadrant tenderness on repeat exam. Labwork--noncontributory.  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 Low concern for cauda equina, conus medullaris syndrome, epidural abscess/hematoma, or any other emergent back pathology.  Benign abdominal exam. Low concern for any acute intra-abdominal pathology. Discussed potential for early appendicitis and patient will need reevaluation within 6-12 hours. Patient voices understanding.  Will DC with naproxen, Robaxin and Vicodin. Discussed f/u with PCP and return precautions, pt very amenable to plan. Pt stable, in good condition and ambulates out of ED without difficulty.  Discussed all lab findings and any associated imaging with patient and they verbalize understanding or results. Final diagnoses:  Right-sided low back pain with right-sided sciatica        Sharlene Motts, PA-C 03/17/14 1203  Audree Camel, MD 03/20/14 [phone]

## 2017-09-08 ENCOUNTER — Encounter

## 2018-03-21 ENCOUNTER — Ambulatory Visit (INDEPENDENT_AMBULATORY_CARE_PROVIDER_SITE_OTHER): Payer: Self-pay | Admitting: Family Medicine

## 2018-03-21 ENCOUNTER — Other Ambulatory Visit: Payer: Self-pay

## 2018-03-21 ENCOUNTER — Encounter: Payer: Self-pay | Admitting: Family Medicine

## 2018-03-21 VITALS — BP 136/80 | HR 86 | Temp 98.2°F | Wt 226.0 lb

## 2018-03-21 DIAGNOSIS — M5442 Lumbago with sciatica, left side: Secondary | ICD-10-CM

## 2018-03-21 DIAGNOSIS — Z7689 Persons encountering health services in other specified circumstances: Secondary | ICD-10-CM

## 2018-03-21 DIAGNOSIS — G8929 Other chronic pain: Secondary | ICD-10-CM

## 2018-03-21 NOTE — Patient Instructions (Signed)

## 2018-03-21 NOTE — Progress Notes (Addendum)
Subjective:   Chief Complaint  Patient presents with  . Annual Exam    back pain   HPI  Mr. Gautam Reynoldson is a previously healthy 28yo M who presents with lower left back pain. He reports he has had this pain for 3-4 years. He states he feels a dull achey pain with occasional sharp pain shooting down his L leg. He occasionally feels some numbness in his L buttocks as well. He states the pain is worse with trying to sit up straight as he usually slouches. He has tried Flexeril and Ibuprofen with only minimal help. 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 He reports feeling a mass in his LL back that has remained about the same size and is not tender to palpation.  Concern today: Back Pain Changes in his/her health in the last 12 months: no Occupation: Janitor   Wears seatbelt: yes.    The patient has regular exercise: lots of walking, movement with work.   Enough vegetables and fruits: yes.  Smokes cigarette: 1 ppd for 3 years, feels tied to stress relief Drinks EtOH: yes, on occasion Drug use: no Patient takes ASA: no.  Patient takes vitD & Ca: takes biotins. Ever been transfused or tattooed?: no.  The patient is sexually active.  Patient uses birth control: not applicable.  Domestic violence: no.  Advance directive: not applicable. MOST: not applicable.   History of depression:no.  Patient dental home: no.  Immunizations  Needs influenza vaccine: no.  Needs HPV (Women until age 86): no.  Needs Shingrix (all >31yrs of age): no.  Needs Tdap: yes.  Needs Pneumococcal: yes. 1. 12 to 28 years of age  -Intermediate risk groups (smokers; chronic heart, lung and liver  disease, DM & alcoholism) PPSV23 alone:  (Grade 1B).   Screening Need colon cancer screening: no. Need breast cancer ccreening: no. Need cervical cancer Screening: not applicable. STOP BANG >/=3 for OSA:  no. Need lung cancer screening (men > 55):no. Need AAA screening (men 65-74, >100 cigarettes):no At risk for skin cancer: no. Need HCV Screening: no. Need STI Screening: no. 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  has a past medical history of Heart murmur.  [facility]  History reviewed. No pertinent family history. Family history of heart disease before age of 46 yrs: no. Family history of stroke: no. Family history of cancer: no.  SH Social History   Tobacco Use  . Smoking status: Current Every Day Smoker    Packs/day: 1.00    Types: Cigarettes  . Smokeless tobacco: Never Used  Substance Use Topics  . Alcohol use: No  . Drug use: No    Review of Systems  Constitutional: Negative for appetite change, chills, fatigue and fever.  Respiratory: Negative for cough and shortness of breath.   Cardiovascular: Negative for chest pain.  Gastrointestinal: Negative for constipation and diarrhea.  Genitourinary: Negative for difficulty urinating and dysuria.       Objective:   Physical Exam Constitutional:      Appearance: Normal appearance.  Neck:     Musculoskeletal: Full passive range of motion without pain.  Cardiovascular:     Rate and Rhythm: Normal rate and regular rhythm.     Pulses: Normal pulses.     Heart sounds: Normal heart sounds.  Comments: No murmur heard Pulmonary:     Effort: Pulmonary effort is normal.     Breath sounds: Normal breath sounds.  Abdominal:     General: Bowel sounds are normal. There is no distension.     Palpations: Abdomen is soft. There is no mass.     Tenderness: There is no abdominal tenderness. There is no right CVA tenderness or left CVA tenderness.  Lymphadenopathy:     Cervical: No cervical adenopathy.  Skin:         Comments: Cystic structure (about 6cm) located in LL back   Neurological:     Mental Status: He is alert.    Vitals:   03/21/18 1400  BP: 136/80  Pulse: 86  Temp: 98.2 F (36.8 C)  TempSrc: Oral  SpO2: 99%  Weight: 226 lb (102.5 kg)   Body mass index is 28.25 kg/m.     Assessment & Plan:  #Chronic low back pain with left-sided radiculopathy  Mr. Jens SomCrenshaw is a 28yo M who presents with chronic low back pain. As his pain has lasted for 3 years and has had only minimal relief with Ibuprofen and Flexeril, will pursue imaging likely lumbar x-ray initially but given radiculopathy suspect bulging disc and may need MRI for further assessment.  We will initially consider physical therapy in combination with possible gabapentin.  If symptoms continue to be severe and bothersome will discuss benefits and merits of steroid injection.  Patient does have a palpable small movable, well-circumscribed mass in his left lower back/flank that appears to be cystic in nature however given radiculopathy and back pain will probably need ultrasound for further assessment.  Minimal suspicion for infection or cancer given lack of systemic symptoms.  Differential left low back pain with radiculopathy includes slipped/bulging disk, pinched nerve from possible spurring, obstructing mass. Less likely to be arthritis given age. However, as Mr. Jens SomCrenshaw is currently uninsured, we will wait to proceed as he works on attaining [employer] or [employer].    Beverly Milchhamara Torie Towle, Medical Student  I have seen and evaluated the patient with medical student [facility] Yuvia Plant. I am in agreement with the note above in its revised form.  Lovena NeighboursAbdoulaye Diallo, MD Family Medicine, PGY-3  03/22/18  3:23 PM

## 2019-05-20 ENCOUNTER — Encounter (HOSPITAL_COMMUNITY): Payer: Self-pay | Admitting: Emergency Medicine

## 2019-05-20 ENCOUNTER — Other Ambulatory Visit: Payer: Self-pay

## 2019-05-20 ENCOUNTER — Emergency Department (HOSPITAL_COMMUNITY)
Admission: EM | Admit: 2019-05-20 | Discharge: 2019-05-20 | Disposition: A | Discharge status: Home / Self Care | Payer: No Typology Code available for payment source | Attending: Emergency Medicine | Admitting: Emergency Medicine

## 2019-05-20 ENCOUNTER — Emergency Department (HOSPITAL_COMMUNITY): Payer: No Typology Code available for payment source

## 2019-05-20 DIAGNOSIS — M546 Pain in thoracic spine: Secondary | ICD-10-CM | POA: Diagnosis not present

## 2019-05-20 DIAGNOSIS — Z79899 Other long term (current) drug therapy: Secondary | ICD-10-CM | POA: Diagnosis not present

## 2019-05-20 DIAGNOSIS — M545 Low back pain: Secondary | ICD-10-CM | POA: Diagnosis not present

## 2019-05-20 MED ORDER — IBUPROFEN 800 MG PO TABS
800.0000 mg | ORAL_TABLET | Freq: Once | ORAL | Status: AC
Start: 2019-05-20 — End: 2019-05-20
  Administered 2019-05-20: 10:00:00 800 mg via ORAL
  Filled 2019-05-20: qty 1
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
## 2019-05-20 NOTE — ED Notes (Signed)
Patient verbalizes understanding of discharge instructions. Opportunity for questioning and answers were provided. Armband removed by staff, pt discharged from ED.  
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
